# Patient Record
Sex: Female | Born: 1990 | Race: Black or African American | Hispanic: No | Marital: Single | Smoking: Never smoker
Health system: Southern US, Community
[De-identification: ages and names within clinical notes are randomized; demographics above are authoritative.]

---

## 2013-05-23 ENCOUNTER — Other Ambulatory Visit (HOSPITAL_COMMUNITY)
Admission: RE | Admit: 2013-05-23 | Discharge: 2013-05-23 | Disposition: A | Discharge status: Home / Self Care | Payer: Managed Care, Other (non HMO) | Source: Ambulatory Visit | Attending: Obstetrics and Gynecology | Admitting: Obstetrics and Gynecology

## 2013-05-23 DIAGNOSIS — Z01419 Encounter for gynecological examination (general) (routine) without abnormal findings: Secondary | ICD-10-CM | POA: Insufficient documentation

## 2013-05-23 DIAGNOSIS — Z113 Encounter for screening for infections with a predominantly sexual mode of transmission: Secondary | ICD-10-CM | POA: Insufficient documentation

## 2013-06-03 ENCOUNTER — Emergency Department (HOSPITAL_COMMUNITY)
Admission: EM | Admit: 2013-06-03 | Discharge: 2013-06-03 | Disposition: A | Discharge status: Home / Self Care | Payer: Managed Care, Other (non HMO) | Attending: Emergency Medicine | Admitting: Emergency Medicine

## 2013-06-03 ENCOUNTER — Encounter (HOSPITAL_COMMUNITY): Payer: Self-pay | Admitting: Emergency Medicine

## 2013-06-03 DIAGNOSIS — J069 Acute upper respiratory infection, unspecified: Secondary | ICD-10-CM | POA: Insufficient documentation

## 2013-06-03 DIAGNOSIS — IMO0001 Reserved for inherently not codable concepts without codable children: Secondary | ICD-10-CM | POA: Insufficient documentation

## 2013-06-03 DIAGNOSIS — J329 Chronic sinusitis, unspecified: Secondary | ICD-10-CM | POA: Insufficient documentation

## 2013-06-03 DIAGNOSIS — R61 Generalized hyperhidrosis: Secondary | ICD-10-CM | POA: Insufficient documentation

## 2013-06-03 DIAGNOSIS — R3915 Urgency of urination: Secondary | ICD-10-CM | POA: Insufficient documentation

## 2013-06-03 LAB — RAPID STREP SCREEN (MED CTR MEBANE ONLY): Streptococcus, Group A Screen (Direct): NEGATIVE

## 2013-06-03 MED ORDER — CLINDAMYCIN HCL 300 MG PO CAPS: 300.0000 mg | ORAL_CAPSULE | Freq: Four times a day (QID) | ORAL | Status: DC

## 2013-06-03 NOTE — ED Notes (Signed)
Pt states Tues night started having night sweats, body aches, bad headaches. Pt feels congested and feels as though she has a fever.

## 2013-06-03 NOTE — ED Provider Notes (Signed)
CSN: [id number]     Arrival date & time 06/03/13  1017 History  This chart was scribed for non-physician practitioner, Trevor Mace, PA-C, working with Roney Marion, MD by Shari Heritage, ED Scribe. This patient was seen in room TR09C/TR09C and the patient's care was started at 10:31 AM.    Chief Complaint  Patient presents with  . URI   The history is provided by the patient. No language interpreter was used.    HPI Comments: Brandy James is a 22 y.o. female who presents to the Emergency Department complaining of constant severe sore throat and dry cough onset 2 days ago. She reports associated body aches, night sweats, headaches, postnasal drip, and congestion. She has been taking Alka Seltzer but her symptoms have persisted without relief. She denies fever or other symptoms at this time. She denies known sick contacts, but she works at [employer] [place] so she is around other frequently. She has no chronic medical conditions. She does not smoke.   History reviewed. No pertinent past medical history. History reviewed. No pertinent past surgical history. History reviewed. No pertinent family history. History  Substance Use Topics  . Smoking status: Not on file  . Smokeless tobacco: Not on file  . Alcohol Use: Not on file   OB History   No data available     Review of Systems  Constitutional: Positive for diaphoresis.  HENT: Positive for congestion, postnasal drip and sore throat.   Respiratory: Positive for cough.   Genitourinary: Positive for urgency.  Musculoskeletal: Positive for myalgias (body aches).  Skin: Negative for rash.  All other systems reviewed and are negative.    Allergies  Review of patient's allergies indicates not on file.  Home Medications  No current outpatient prescriptions on file. Triage Vitals: BP 136/78  Pulse 92  Temp(Src) 97.3 F (36.3 C) (Oral)  Resp 16  SpO2 99%  LMP 03/06/2013 Physical Exam  Nursing note and vitals  reviewed. Constitutional: She is oriented to person, place, and time. She appears well-developed and well-nourished. No distress.  HENT:  Head: Normocephalic and atraumatic.  Right Ear: External ear normal.  Left Ear: External ear normal.  Nose: Mucosal edema present. Right sinus exhibits maxillary sinus tenderness and frontal sinus tenderness. Left sinus exhibits maxillary sinus tenderness and frontal sinus tenderness.  Mouth/Throat: Oropharynx is clear and moist. No oropharyngeal exudate.  Tonsils enlarged and inflamed bilaterally +2. No exudate. Postnasal drip. Enlarged and tender tonsillar and anterior cervical lymph nodes.  Eyes: EOM are normal.  Neck: Neck supple. No tracheal deviation present.  Cardiovascular: Normal rate.   Pulmonary/Chest: Effort normal and breath sounds normal. No respiratory distress. She has no decreased breath sounds. She has no wheezes. She has no rhonchi. She has no rales.  Musculoskeletal: Normal range of motion.  Neurological: She is alert and oriented to person, place, and time.  Skin: Skin is warm and dry.  Psychiatric: She has a normal mood and affect. Her behavior is normal.    ED Course  Procedures (including critical care time) DIAGNOSTIC STUDIES: Oxygen Saturation is 99% on room air, normal by my interpretation.    COORDINATION OF CARE: 10:32 AM- Will order strep screen. If negative, advised patient to rest, take acetaminophen or ibuprofen for symptoms, and drink plenty of fluids Patient informed of current plan for treatment and evaluation and agrees with plan at this time.   Results for orders placed during the hospital encounter of 06/03/13  RAPID STREP SCREEN  Result Value Range   Streptococcus, Group A Screen (Direct) NEGATIVE  NEGATIVE    EKG Interpretation   None       MDM   1. URI (upper respiratory infection)   2. Sinusitis    Well appearing, NAD, normal VS. Rapid strep negative. Symptomatic tx discussed. Return  precautions given. Patient states understanding of treatment care plan and is agreeable.   I personally performed the services described in this documentation, which was scribed in my presence. The recorded information has been reviewed and is accurate.    Trevor Mace, PA-C 06/03/13 1105

## 2013-06-05 LAB — CULTURE, GROUP A STREP

## 2013-06-13 NOTE — ED Provider Notes (Signed)
Medical screening examination/treatment/procedure(s) were performed by non-physician practitioner and as supervising physician I was immediately available for consultation/collaboration.  EKG Interpretation   None         Dilraj Killgore J Roise Emert, MD 06/13/13 2310 

## 2013-10-12 ENCOUNTER — Emergency Department (HOSPITAL_COMMUNITY): Payer: BC Managed Care – PPO

## 2013-10-12 ENCOUNTER — Emergency Department (HOSPITAL_COMMUNITY)
Admission: EM | Admit: 2013-10-12 | Discharge: 2013-10-12 | Disposition: A | Discharge status: Home / Self Care | Payer: BC Managed Care – PPO | Attending: Emergency Medicine | Admitting: Emergency Medicine

## 2013-10-12 ENCOUNTER — Encounter (HOSPITAL_COMMUNITY): Payer: Self-pay | Admitting: Emergency Medicine

## 2013-10-12 DIAGNOSIS — M25512 Pain in left shoulder: Secondary | ICD-10-CM

## 2013-10-12 DIAGNOSIS — M25519 Pain in unspecified shoulder: Secondary | ICD-10-CM | POA: Insufficient documentation

## 2013-10-12 DIAGNOSIS — Z3202 Encounter for pregnancy test, result negative: Secondary | ICD-10-CM | POA: Insufficient documentation

## 2013-10-12 DIAGNOSIS — M791 Myalgia, unspecified site: Secondary | ICD-10-CM

## 2013-10-12 DIAGNOSIS — IMO0001 Reserved for inherently not codable concepts without codable children: Secondary | ICD-10-CM | POA: Insufficient documentation

## 2013-10-12 DIAGNOSIS — N390 Urinary tract infection, site not specified: Secondary | ICD-10-CM | POA: Insufficient documentation

## 2013-10-12 LAB — URINE MICROSCOPIC-ADD ON

## 2013-10-12 LAB — URINALYSIS, ROUTINE W REFLEX MICROSCOPIC
Bilirubin Urine: NEGATIVE
GLUCOSE, UA: NEGATIVE mg/dL
HGB URINE DIPSTICK: NEGATIVE
Ketones, ur: NEGATIVE mg/dL
Nitrite: NEGATIVE
PH: 6 (ref 5.0–8.0)
PROTEIN: 100 mg/dL — AB
Specific Gravity, Urine: 1.027 (ref 1.005–1.030)
Urobilinogen, UA: 0.2 mg/dL (ref 0.0–1.0)

## 2013-10-12 LAB — PREGNANCY, URINE: PREG TEST UR: NEGATIVE

## 2013-10-12 MED ORDER — NAPROXEN 375 MG PO TABS: 375.0000 mg | ORAL_TABLET | Freq: Two times a day (BID) | ORAL | Status: AC

## 2013-10-12 MED ORDER — CIPROFLOXACIN HCL 250 MG PO TABS: 250.0000 mg | ORAL_TABLET | Freq: Two times a day (BID) | ORAL | Status: AC

## 2013-10-12 MED ORDER — CYCLOBENZAPRINE HCL 10 MG PO TABS: 10.0000 mg | ORAL_TABLET | Freq: Two times a day (BID) | ORAL | Status: AC | PRN

## 2013-10-12 NOTE — ED Notes (Signed)
Patient returned from radiology

## 2013-10-12 NOTE — Discharge Instructions (Signed)
Please call your doctor for a followup appointment within 24-48 hours. When you talk to your doctor please let them know that you were seen in the emergency department and have them acquire all of your records so that they can discuss the findings with you and formulate a treatment plan to fully care for your new and ongoing problems. Please call and set-up an appointment with health and wellness center to be re-assessed Please rest and stay hydrated Please take medications as prescribed Please drink plenty of water and can drink watered down cranberry juice Please avoid any physical or strenuous activity Please apply ice and massage Please continue to monitor symptoms and if symptoms are to worsen or change (fever greater than 101, chills, chest pain, shortness of breath, difficulty breathing, neck pain, neck stiffness, abdominal pain, nausea, vomiting, diarrhea, blood in stools, black for stools, dizziness, headache, fall, numbness, tingling, pain with urination, decreased urination) please report back to the ED immediately  Myalgia, Adult Myalgia is the medical term for muscle pain. It is a symptom of many things. Nearly everyone at some time in their life has this. The most common cause for muscle pain is overuse or straining and more so when you are not in shape. Injuries and muscle bruises cause myalgias. Muscle pain without a history of injury can also be caused by a virus. It frequently comes along with the flu. Myalgia not caused by muscle strain can be present in a large number of infectious diseases. Some autoimmune diseases like lupus and fibromyalgia can cause muscle pain. Myalgia may be mild, or severe. SYMPTOMS  The symptoms of myalgia are simply muscle pain. Most of the time this is short lived and the pain goes away without treatment. DIAGNOSIS  Myalgia is diagnosed by your caregiver by taking your history. This means you tell him when the problems began, what they are, and what has  been happening. If this has not been a long term problem, your caregiver may want to watch for a while to see what will happen. If it has been long term, they may want to do additional testing. TREATMENT  The treatment depends on what the underlying cause of the muscle pain is. Often anti-inflammatory medications will help. HOME CARE INSTRUCTIONS  If the pain in your muscles came from overuse, slow down your activities until the problems go away.  Myalgia from overuse of a muscle can be treated with alternating hot and cold packs on the muscle affected or with cold for the first couple days. If either heat or cold seems to make things worse, stop their use.  Apply ice to the sore area for 15-20 minutes, 03-04 times per day, while awake for the first 2 days of muscle soreness, or as directed. Put the ice in a plastic bag and place a towel between the bag of ice and your skin.  Only take over-the-counter or prescription medicines for pain, discomfort, or fever as directed by your caregiver.  Regular gentle exercise may help if you are not active.  Stretching before strenuous exercise can help lower the risk of myalgia. It is normal when beginning an exercise regimen to feel some muscle pain after exercising. Muscles that have not been used frequently will be sore at first. If the pain is extreme, this may mean injury to a muscle. SEEK MEDICAL CARE IF:  You have an increase in muscle pain that is not relieved with medication.  You begin to run a temperature.  You develop  nausea and vomiting.  You develop a stiff and painful neck.  You develop a rash.  You develop muscle pain after a tick bite.  You have continued muscle pain while working out even after you are in good condition. SEEK IMMEDIATE MEDICAL CARE IF: Any of your problems are getting worse and medications are not helping. MAKE SURE YOU:   Understand these instructions.  Will watch your condition.  Will get help right  away if you are not doing well or get worse. Document Released: 05/08/2006 Document Revised: 09/08/2011 Document Reviewed: 07/28/2006 [facility] Patient Information 2014 [place], [place].  Urinary Tract Infection Urinary tract infections (UTIs) can develop anywhere along your urinary tract. Your urinary tract is your body's drainage system for removing wastes and extra water. Your urinary tract includes two kidneys, two ureters, a bladder, and a urethra. Your kidneys are a pair of bean-shaped organs. Each kidney is about the size of your fist. They are located below your ribs, one on each side of your spine. CAUSES Infections are caused by microbes, which are microscopic organisms, including fungi, viruses, and bacteria. These organisms are so small that they can only be seen through a microscope. Bacteria are the microbes that most commonly cause UTIs. SYMPTOMS  Symptoms of UTIs may vary by age and gender of the patient and by the location of the infection. Symptoms in young women typically include a frequent and intense urge to urinate and a painful, burning feeling in the bladder or urethra during urination. Older women and men are more likely to be tired, shaky, and weak and have muscle aches and abdominal pain. A fever may mean the infection is in your kidneys. Other symptoms of a kidney infection include pain in your back or sides below the ribs, nausea, and vomiting. DIAGNOSIS To diagnose a UTI, your caregiver will ask you about your symptoms. Your caregiver also will ask to provide a urine sample. The urine sample will be tested for bacteria and white blood cells. White blood cells are made by your body to help fight infection. TREATMENT  Typically, UTIs can be treated with medication. Because most UTIs are caused by a bacterial infection, they usually can be treated with the use of antibiotics. The choice of antibiotic and length of treatment depend on your symptoms and the type of bacteria  causing your infection. HOME CARE INSTRUCTIONS  If you were prescribed antibiotics, take them exactly as your caregiver instructs you. Finish the medication even if you feel better after you have only taken some of the medication.  Drink enough water and fluids to keep your urine clear or pale yellow.  Avoid caffeine, tea, and carbonated beverages. They tend to irritate your bladder.  Empty your bladder often. Avoid holding urine for long periods of time.  Empty your bladder before and after sexual intercourse.  After a bowel movement, women should cleanse from front to back. Use each tissue only once. SEEK MEDICAL CARE IF:   You have back pain.  You develop a fever.  Your symptoms do not begin to resolve within 3 days. SEEK IMMEDIATE MEDICAL CARE IF:   You have severe back pain or lower abdominal pain.  You develop chills.  You have nausea or vomiting.  You have continued burning or discomfort with urination. MAKE SURE YOU:   Understand these instructions.  Will watch your condition.  Will get help right away if you are not doing well or get worse. Document Released: 03/26/2005 Document Revised: 12/16/2011 Document Reviewed:  07/25/2011 ExitCare Patient Information 2014 [place], [place].   Emergency Department Resource Guide 1) Find a Doctor and Pay Out of Pocket Although you won't have to find out who is covered by your insurance plan, it is a good idea to ask around and get recommendations. You will then need to call the office and see if the doctor you have chosen will accept you as a new patient and what types of options they offer for patients who are self-pay. Some doctors offer discounts or will set up payment plans for their patients who do not have insurance, but you will need to ask so you aren't surprised when you get to your appointment.  2) Contact Your Local Health Department Not all health departments have doctors that can see patients for sick visits, but  many do, so it is worth a call to see if yours does. If you don't know where your local health department is, you can check in your phone book. The CDC also has a tool to help you locate your state's health department, and many state websites also have listings of all of their local health departments.  3) Find a Walk-in Clinic If your illness is not likely to be very severe or complicated, you may want to try a walk in clinic. These are popping up all over the country in pharmacies, drugstores, and shopping centers. They're usually staffed by nurse practitioners or physician assistants that have been trained to treat common illnesses and complaints. They're usually fairly quick and inexpensive. However, if you have serious medical issues or chronic medical problems, these are probably not your best option.  No Primary Care Doctor: - Call Health Connect at  [phone] - they can help you locate a primary care doctor that  accepts your insurance, provides certain services, etc. - Physician Referral Service- [phone]  Chronic Pain Problems: Organization         Address  Phone   Notes  [facility]  [phone] Patients need to be referred by their primary care doctor.   Medication Assistance: Organization         Address  Phone   Notes  [facility] Medication [facility] [address] [place], [place] [address] [phone] --Must be a resident of [facility] -- Must have NO insurance coverage whatsoever (no Medicaid/ Medicare, etc.) -- The pt. MUST have a primary care doctor that directs their care regularly and follows them in the community   [facility]  [phone]   [employer]  [phone]    Agencies that provide inexpensive medical care: Organization         Address  Phone   Notes  [facility]  [phone]   [facility]    [phone]   [facility] [address] [place], [place] [address] [phone]   [facility] [address] [place] [phone]   Planned Parenthood    [phone]   [facility]    [phone]   [facility]  [address], [place] Phone:  [phone], Fax:  [phone] Hours of Operation:  9 am - 6 pm, M-F.  Also accepts Medicaid/Medicare and self-pay.  [facility]  [address], [place] Phone: [phone], Fax: [phone]. Hours of Operation:  8:30 am - 5:30 pm, M-F.  Also accepts Medicaid and self-pay.  [facility] [place] [address] [place] Phone: [phone]   [facility] [address] [place] [place], [place] [phone], Ext. [phone] Mondays & Thursdays: 7-9 AM.  First 15 patients are seen on a first come, first serve basis.    Medicaid-accepting [facility] Providers:  Organization         Address  Phone   Notes  [facility] [address],  [phone] Also accepts self-pay patients.  [facility] [address] Laurell Josephs [place], [place]  [phone]   [facility] [address], [place] [phone]   [facility] [address], [place] [phone]   Renaye Rakers [address], [place]   [phone] Only accepts [place] patients after they have their name applied to their card.   Self-Pay (no insurance) in [facility]:  Organization         Address  Phone   Notes  Sickle Cell Patients, [facility] [address] [place], [place] [phone]   [facility] [address] [place], [place] [phone]   [facility] [place]  [address], [place] [phone]   [facility]/Dr. Osei-Bonsu  [address], [place] or  [address], [place] [phone] Phone number for both [place] and [place] locations is the same.  [facility] [address], [place] [phone]   [facility] [address], [place] or [address] [phone]   [facility] [address] [place] [phone], phone; [phone], fax Sees patients 1st and 3rd Saturday of every month.  Must not qualify for public or private insurance (i.e. Medicaid, Medicare, [place] Health Choice, Veterans' Benefits)  Household income should be no more than 200% of the poverty level The clinic cannot treat you if you are pregnant or think you are pregnant  Sexually transmitted diseases are not treated at the clinic.    Dental Care: Organization         Address  Phone  Notes  [facility] [address] [place], [place] [phone] Accepts children up to age 30 who are enrolled in [place] or [place] Health Choice; pregnant women with a Medicaid card; and children who have applied for Medicaid or [place] Health Choice, but were declined, whose parents can pay a reduced fee at time of service.  [facility] of [facility]  [address], [place] [phone] Accepts children up to age 58 who are enrolled in [place] or [place] Health Choice; pregnant women with a Medicaid card; and children who have applied for Medicaid or [place] Health Choice, but were declined, whose parents can pay a reduced fee at time of service.  [facility]  [address] [place] [address] [place], [place] [phone] Patients are seen by appointment only. Walk-ins are not accepted. [facility] will see patients 78 years of age and older. Monday - Tuesday (8am-5pm) Most Wednesdays (8:30-5pm) $30 per visit, cash only  [facility]  [address], [facility] [phone] Patients are seen by appointment only. Walk-ins are not accepted. [facility]  will see patients 22 years of age and older. One Wednesday Evening (Monthly: Volunteer Based).  $30 per visit, cash only  [employer] [facility]  [phone]) [phone] for adults; Children under age 544, call Graduate Pediatric Dentistry at [phone]. Children aged 464-14, please call [phone] to request a pediatric application.  Dental services are provided in all areas of dental care including fillings, crowns and bridges, complete and partial dentures, implants, gum treatment, root canals, and extractions. Preventive care is also provided. Treatment is provided to both adults and children. Patients are selected via a lottery and there is often a waiting list.   [facility] [address], [place]  [phone] [email or web address]   [facility] [address], [place], [place] [phone], Ext. [phone] Second and Fourth Thursday of each month, opens at 6:30 AM; Clinic ends at 9 AM.  Patients are seen on a first-come first-served basis, and a limited number are seen during each clinic.   [facility]  [address], [place], [place] [phone]   Eligibility Requirements You must have lived in [place], [place], or [place] counties for at least the last three months.   You cannot be eligible for state or federal sponsored [place] Cityhealthcare insurance, including CIGNAVeterans Administration, [place]icaid, or [employer]icare.   You generally cannot be eligible for healthcare insurance through your employer.    How to apply: Eligibility screenings are held every Tuesday and Wednesday afternoon from 1:00 pm until 4:00 pm. You do not need an appointment for the interview!  [facility] [address], [place], [place] [phone]   [facility]  [phone]   [facility]  [phone]   [facility]  [phone]    Behavioral Health Resources in the Community: Intensive Outpatient Programs Organization         Address  Phone  Notes  [facility] [address], [place], [place] [phone]   [facility] [address], [place], [place] [phone]   [facility] [address] [place] [address], [place], [place]  [phone]   [facility] [address],  [place], [place] [phone] or [phone]   Substance Abuse Resources Organization         Address  Phone  Notes  [facility]  [phone]   [facility]  [phone]   [facility]  [phone]   [facility]  [phone]   Residential & Outpatient Substance Abuse Program  [phone]   Psychological Services Organization         Address  Phone  Notes  [facility]  [phone]- [phone]   [facility]  [phone]- [phone]   [facility] [address], [place] [phone] or [phone]    Mobile Crisis Teams Organization         Address  Phone  Notes  [facility], Mobile Crisis Care Unit  [phone]   [facility]  [address] [place], [place] [phone]   Doristine LocksSharon DeEsch [address] [place] [phone]    Self-Help/Support Groups Organization         Address  Phone             Notes  [facility] [place] - variety of support groups  336- [phone] Call for more information  Narcotics Anonymous (NA), [facility] [address],  [place]  2 meetings at this location   Residential Treatment Programs Organization         Address  Phone  Notes  [facility] [address],    [place]  [phone]   [facility]  [address], [place] [phone], [place], [place] [phone]   [facility] [address], [place] [phone]  Admissions: 8am-3pm M-F  [facility] [address]    [place], [place] [phone]   [facility] [address], [place], [place] [phone]   [facility] [address]  [place], [place] [phone]   [facility] - Intensive Outpatient [address], [place], [place] [phone]   [facility] ([facility] [address]  [place], [place] [phone] or [phone]   [facility] ([facility]) [address] [place], [place] [phone] Accepts Medicaid  [facility] [address]  [place] [phone] Substance Abuse/Addiction Treatment   [facility] [place] [facility] Resources Organization         Address  Phone  Notes  [facility]  [phone]) [phone]   Angie FavaJulie Brannon, PhD [address], [address] [place], [place]   [phone] or [phone]) [phone]   [facility]   [address] [place], [place] [phone]   [facility] [address], [place], [place] [phone]) [phone] Insurance/Medicaid/sponsorship through [facility]  [facility] [address]                                    [place], [place] [phone]) [phone] Therapy/tele-psych/case  [facility] [address].   [place], [place] [phone]    Dr. Lolly MustacheArfeen  [phone]) [phone]   [facility]  [employer] [facility] 1) [address], [place] 2) [address], [place] 3)  [address], [place] [phone]  [phone]) [phone]   [facility] Child Abuse Hotline [phone]) [phone] or [phone](336) [phone] (After Hours)

## 2013-10-12 NOTE — ED Notes (Signed)
Patient states movement makes the pain worse.    Patient states hard to get comfortable when she lays down.  Patient has taken advil 400mg  with no relief.   Patient denies any other symptoms.

## 2013-10-12 NOTE — ED Notes (Signed)
Pt c/o L scapula/shoulder pain.  Pt denies injury/trauma.  Pt alert and oriented.  Talking in complete sentences.  Able to ambulate independently without assistance.

## 2013-10-12 NOTE — ED Notes (Signed)
Disregard my discharge notes.  Entered on wrong patient.

## 2013-10-12 NOTE — ED Provider Notes (Signed)
CSN: [id number]     Arrival date & time 10/12/13  0909 History  This chart was scribed for Raymon Mutton, PA, working with Benny Lennert, MD, by Ardelia Mems ED Scribe. This patient was seen in room TR06C/TR06C and the patient's care was started at 9:49 AM.   Chief Complaint  Patient presents with  . Shoulder Pain    The history is provided by the patient. No language interpreter was used.    HPI Comments: Brandy James is a 23 y.o. female who presents to the Emergency Department complaining of gradually worsening left shoulder pain onset 4 days ago. She locates her pain to the left scapula area and states that her pain does not radiate. She states that her pain is worsened with deep breathing and with movement of her shoulder. She describes her pain as "soreness" while at rest, and as "tightness" and "pulling" while moving her shoulder. She denies any injury to have onset her pain. She states that she has taken 400 mg of Ibuprofen without relief. She denies numbness, tingling, chest pain, dyspnea, headache, back pain, neck pain, fever, chills, cough, nasal congestion, dysuria, hematuria, urinary frequency or any other symptoms.   History reviewed. No pertinent past medical history. History reviewed. No pertinent past surgical history. No family history on file. History  Substance Use Topics  . Smoking status: Never Smoker   . Smokeless tobacco: Not on file  . Alcohol Use: No   OB History   Grav Para Term Preterm Abortions TAB SAB Ect Mult Living                 Review of Systems  Constitutional: Negative for fever and chills.  HENT: Negative for congestion.   Respiratory: Negative for cough and shortness of breath.   Cardiovascular: Negative for chest pain.  Genitourinary: Negative for dysuria, frequency and hematuria.  Musculoskeletal: Positive for arthralgias (left shoulder). Negative for back pain and neck pain.  Neurological: Negative for numbness and headaches.  All  other systems reviewed and are negative.   Allergies  Review of patient's allergies indicates no known allergies.  Home Medications   Prior to Admission medications   Medication Sig Start Date End Date Taking? Authorizing Provider  Phenyleph-Doxyl-DM-Aspirin (ALKA-SELTZER PLUS DAY/NIGHT PO) Take 1 tablet by mouth 2 (two) times daily as needed (cold).    Historical Provider, MD   Triage Vitals: BP 121/73  Pulse 68  Temp(Src) 97.7 F (36.5 C) (Oral)  Resp 18  Ht 5\' 7"  (1.702 m)  Wt 120 lb (54.432 kg)  BMI 18.79 kg/m2  SpO2 100%  LMP 09/11/2013  Physical Exam  Nursing note and vitals reviewed. Constitutional: She is oriented to person, place, and time. She appears well-developed and well-nourished. No distress.  HENT:  Head: Normocephalic and atraumatic.  Mouth/Throat: Oropharynx is clear and moist. No oropharyngeal exudate.  Eyes: Conjunctivae and EOM are normal. Pupils are equal, round, and reactive to light. Right eye exhibits no discharge. Left eye exhibits no discharge.  Neck: Normal range of motion. No tracheal deviation present.  Negative neck stiffness Negative nuchal rigidity Negative cervical lymphadenopathy  Cardiovascular: Normal rate, regular rhythm and normal heart sounds.  Exam reveals no friction rub.   No murmur heard. Pulses:      Radial pulses are 2+ on the right side, and 2+ on the left side.  Pulmonary/Chest: Effort normal and breath sounds normal. No respiratory distress. She has no wheezes. She has no rales. She exhibits no tenderness.  Patient  is able to speak in full sentences without difficulty Negative use of excess her muscles Negative stridor  Musculoskeletal: Normal range of motion.  Negative swelling, erythema, formation, lesions, sores, sunken in appearance or deformities noted to the left shoulder. Discomfort upon palpation to the base of the left scapula and left paraspinal region of the upper thoracic region. Negative palpation of masses or  lesions. Muscular in nature. Negative winged scapula. Full ROM to upper and lower extremities without difficulty noted, negative ataxia noted.  Lymphadenopathy:    She has no cervical adenopathy.  Neurological: She is alert and oriented to person, place, and time. No cranial nerve deficit. She exhibits normal muscle tone. Coordination normal.  Cranial nerves III-XII grossly intact Strength 5+/5+ to upper and lower extremities bilaterally with resistance applied, equal distribution noted  Skin: Skin is warm and dry. No rash noted. She is not diaphoretic. No erythema.  Psychiatric: She has a normal mood and affect. Her behavior is normal. Thought content normal.    ED Course  Procedures (including critical care time)  DIAGNOSTIC STUDIES: Oxygen Saturation is 100% on RA, normal by my interpretation.    COORDINATION OF CARE: 9:56 AM- Discussed normal radiology findings. Discussed clinical suspicion that pt has muscular pain. Discussed plan to obtain UA. Pt advised of plan for treatment and pt agrees.  Labs Review Labs Reviewed  URINALYSIS, ROUTINE W REFLEX MICROSCOPIC - Abnormal; Notable for the following:    APPearance HAZY (*)    Protein, ur 100 (*)    Leukocytes, UA SMALL (*)    All other components within normal limits  URINE MICROSCOPIC-ADD ON - Abnormal; Notable for the following:    Squamous Epithelial / LPF FEW (*)    Bacteria, UA FEW (*)    All other components within normal limits  URINE CULTURE  PREGNANCY, URINE    Imaging Review Dg Chest 2 View  10/12/2013   CLINICAL DATA:  Chest pain, left shoulder pain  EXAM: CHEST  2 VIEW  COMPARISON:  None.  FINDINGS: The heart size and mediastinal contours are within normal limits. Both lungs are clear. The visualized skeletal structures are unremarkable.  IMPRESSION: No active cardiopulmonary disease.   Electronically Signed   By: Natasha MeadLiviu  Pop M.D.   On: 10/12/2013 09:42   Dg Shoulder Left  10/12/2013   CLINICAL DATA:  Left  shoulder pain, chest pain  EXAM: LEFT SHOULDER - 2+ VIEW  COMPARISON:  None.  FINDINGS: Three views of the left shoulder submitted. No acute fracture or subluxation. No radiopaque foreign body.  IMPRESSION: Negative.   Electronically Signed   By: Natasha MeadLiviu  Pop M.D.   On: 10/12/2013 09:41     EKG Interpretation None      MDM   Final diagnoses:  UTI (urinary tract infection)  Left shoulder pain  Muscular pain   Filed Vitals:   10/12/13 0912 10/12/13 1126 10/12/13 1128  BP: 121/73 124/63 103/82  Pulse: 68 63 95  Temp: 97.7 F (36.5 C)    TempSrc: Oral    Resp: 18 18 18   Height: 5\' 7"  (1.702 m)    Weight: 120 lb (54.432 kg)    SpO2: 100% 99% 97%    I personally performed the services described in this documentation, which was scribed in my presence. The recorded information has been reviewed and is accurate.  Patient presenting to the ED with left shoulder pain that started on Saturday localized to the left shoulder blade described as a dull soreness with breast  with a tightening pulling sensation and sharp pain with motion. Stated that the chest discomfort with deep inhalation. Stated the pain stays within the left shoulder without radiation. Stated that she's been using Advil with no relief. Denied numbness, tingling, fall, injury, chest pain, shortness of breath, difficulty breathing, weakness, numbness, tingling. Alert and oriented. GCS 15. Heart rate and rhythm normal. Lungs good auscultation to upper and lower lobes bilaterally. Negative use of accessory muscles. Negative stridor. Patient is able to speak in full sentences without difficulty. Negative deformities, sunken in appearance noted to the left shoulder. Discomfort upon palpation to the musculature of the base of the left shoulder and paraspinal region of the upper thoracic region. Negative masses or lesions palpated. Negative winged scapula. Full range of motion to left shoulder without difficulty or ataxia. Strength intact  with equal distribution with resistance applied. Sensation intact. Plain film of left shoulder negative for acute osseous injury dislocation. Chest x-ray negative for acute cardiopulmonary disease. Urine pregnancy negative. Urinalysis noted small leukocytes with white blood cell count of 11-20 with few bacteria and few squamous cells.  Doubt pneumothorax. Doubt pneumonia or pulmonary disease. Negative focal neurological deficits noted. Strength intact. Suspicion to be muscular pain, left shoulder strain, secondary to pain with motion pain upon palpation. Patient presenting with urinary tract infection-will treat patient. Patient stable, afebrile. Patient no sign of respiratory distress. Patient not septic appearing. Discharged patient with muscle relaxer, anti-inflammatory, antibiotics. Discussed with patient to rest, ice, elevate. Discussed with patient to stay hydrated. Discussed with patient to avoid any physical strenuous activity. [facility] to be reassessed. Discussed with patient to closely monitor symptoms and if symptoms are to worsen or change to report back to the ED - strict return instructions given.    Raymon MuttonMarissa Jemarion Roycroft, PA-C 10/12/13 1921

## 2013-10-13 LAB — URINE CULTURE
Colony Count: >=100000
SPECIAL REQUESTS: NORMAL

## 2013-10-13 NOTE — ED Provider Notes (Signed)
Medical screening examination/treatment/procedure(s) were performed by non-physician practitioner and as supervising physician I was immediately available for consultation/collaboration.   EKG Interpretation None        Seanpatrick Maisano L Johnrobert Foti, MD 10/13/13 1430 

## 2014-11-09 ENCOUNTER — Ambulatory Visit: Payer: BLUE CROSS/BLUE SHIELD | Admitting: Internal Medicine

## 2014-11-10 ENCOUNTER — Ambulatory Visit: Payer: BLUE CROSS/BLUE SHIELD | Admitting: Internal Medicine

## 2014-11-10 DIAGNOSIS — Z0289 Encounter for other administrative examinations: Secondary | ICD-10-CM

## 2015-08-27 IMAGING — CR DG CHEST 2V
2 series · 2 of 2 positions shown · non-contrast
Comparison: None.

CLINICAL DATA: Chest pain, left shoulder pain

EXAM:
CHEST  2 VIEW

[w chest pa]
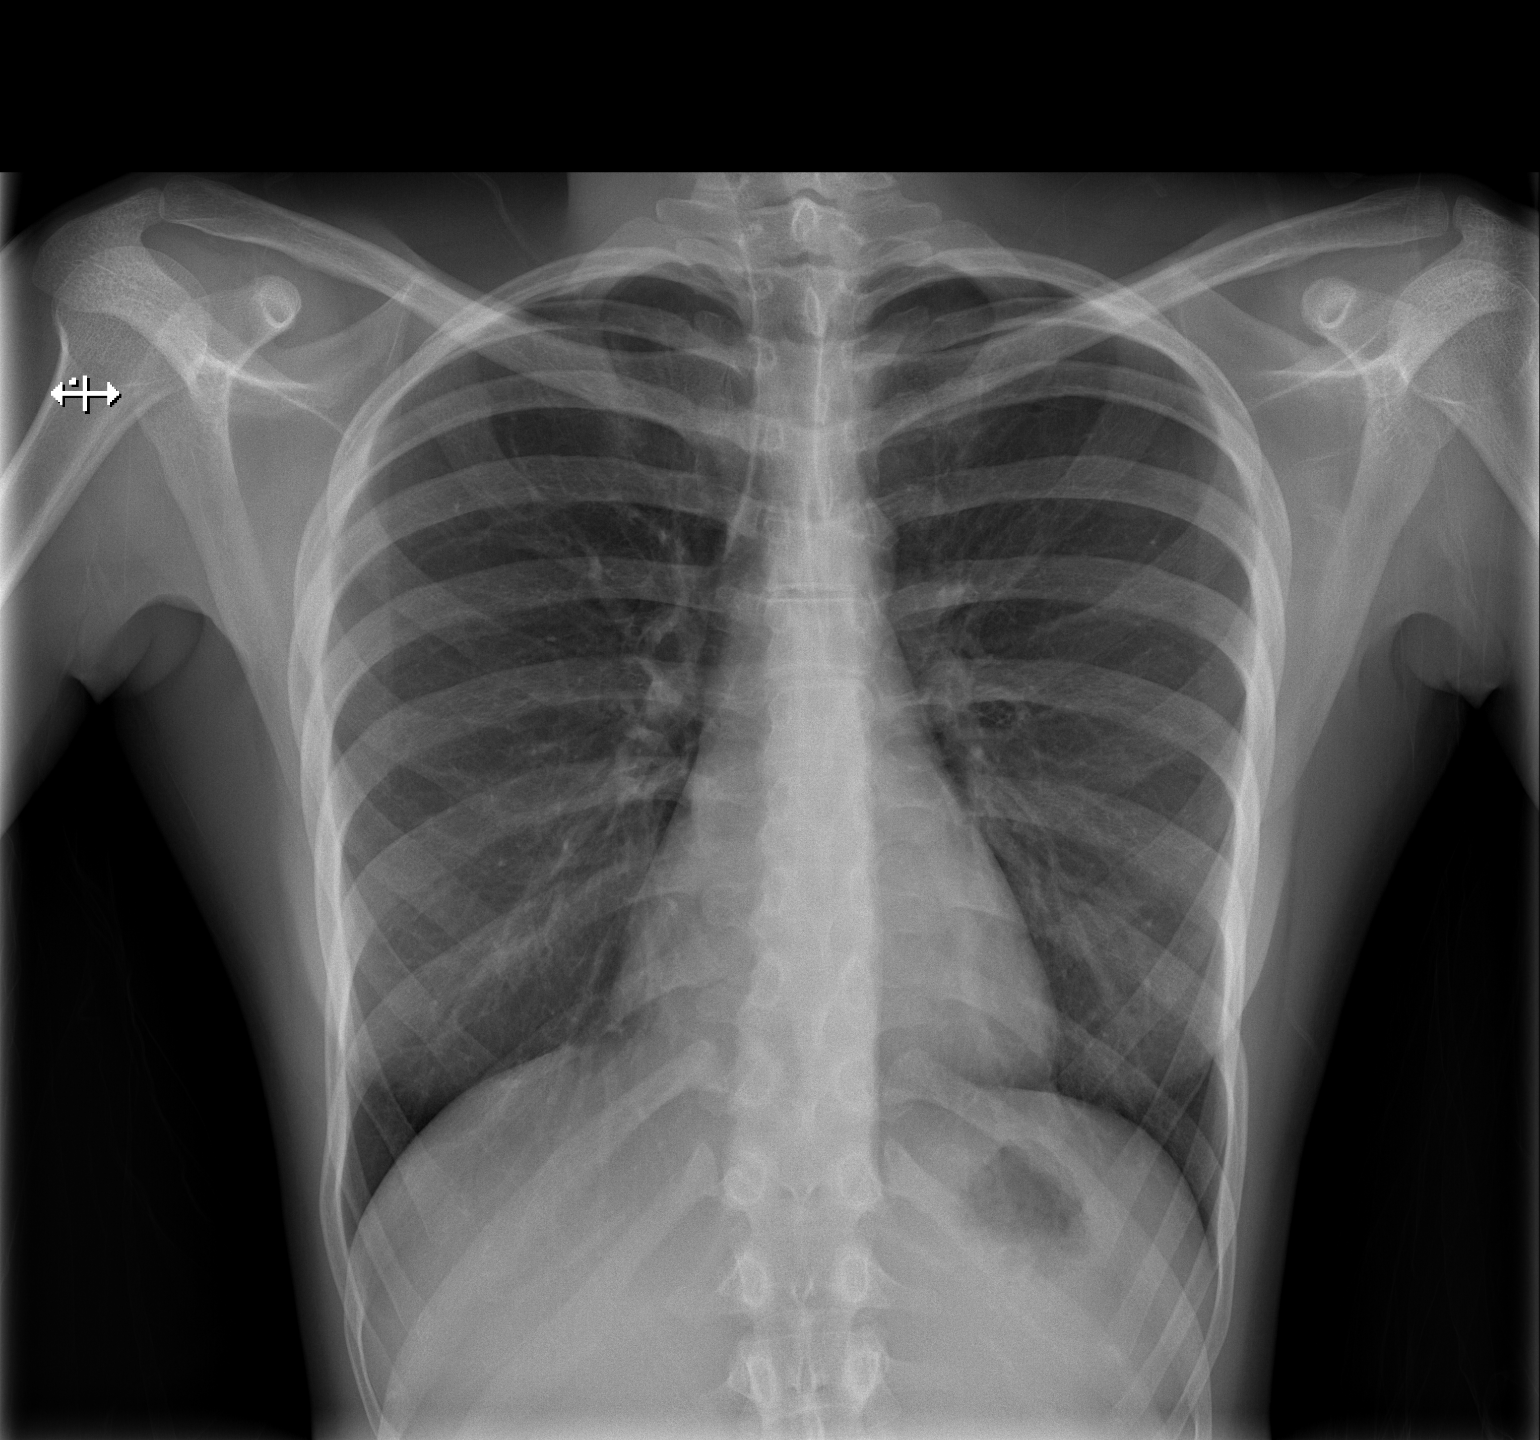

[w chest lat]
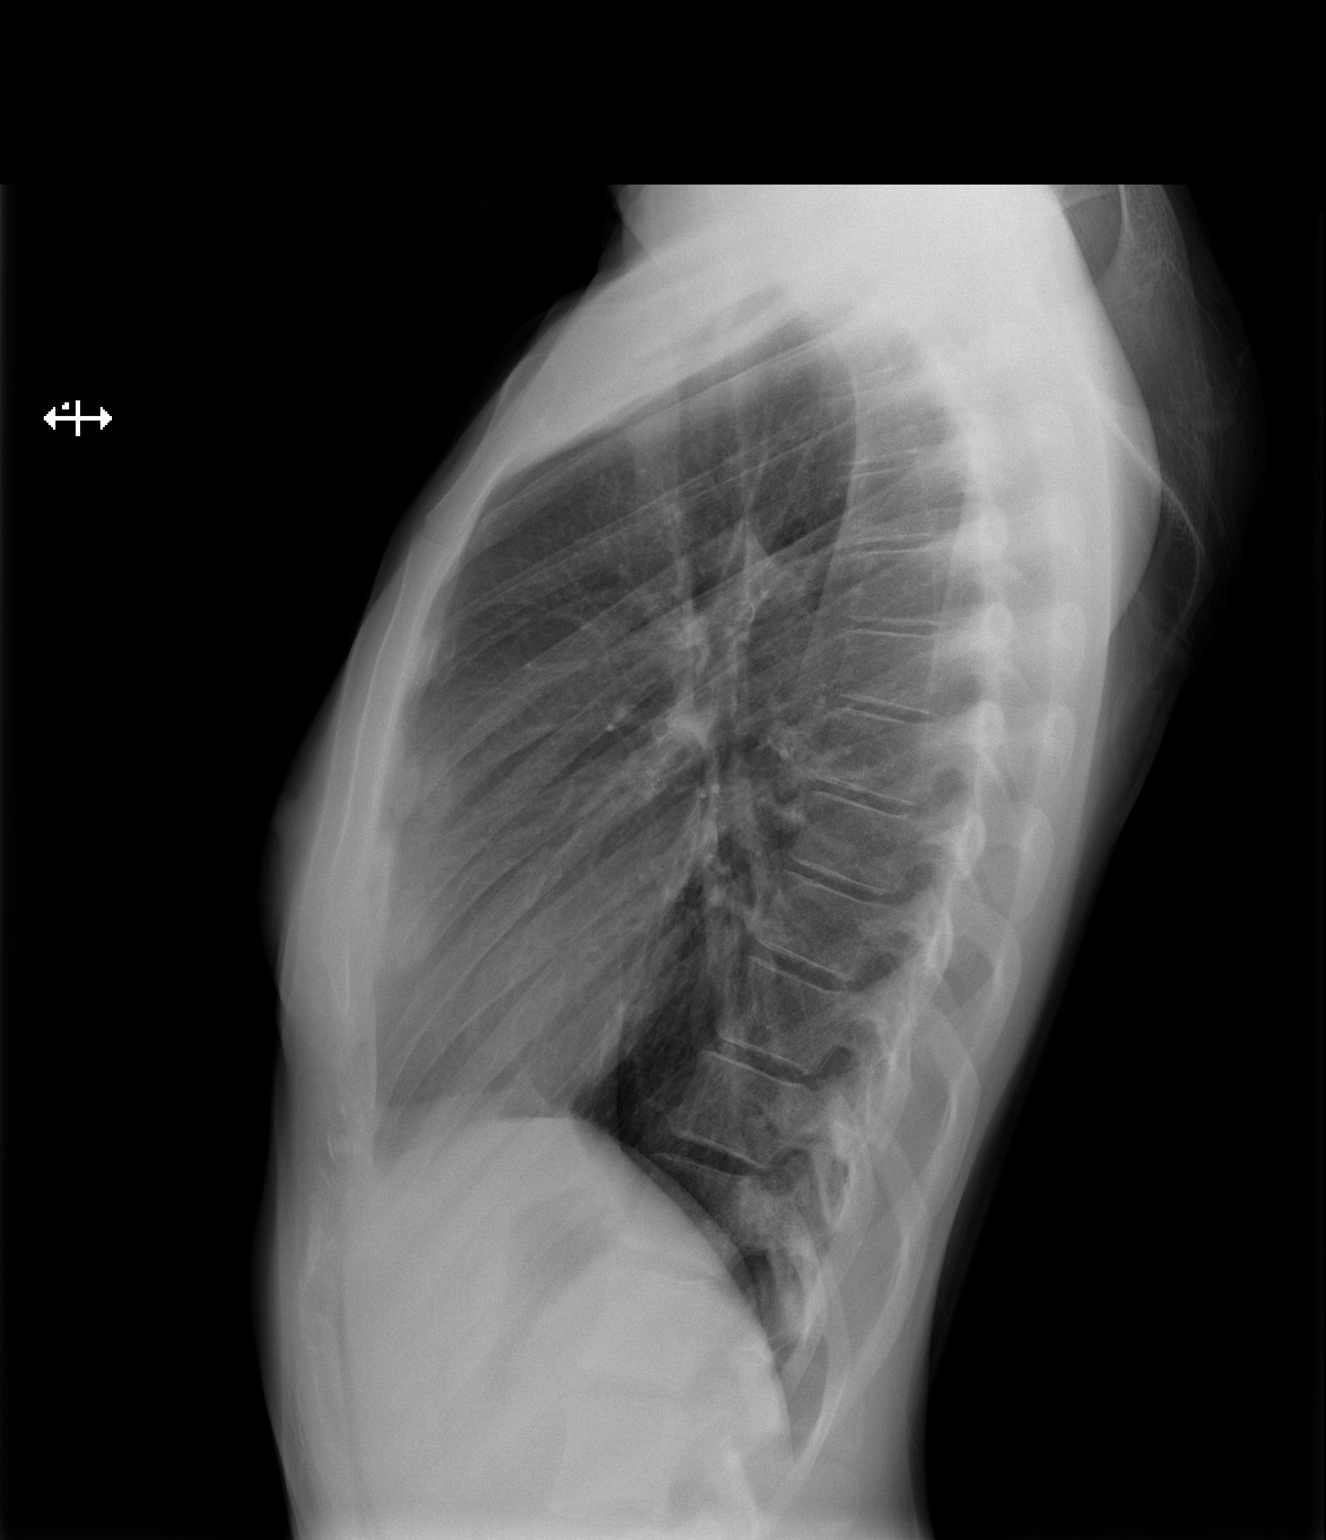

[2 of 2 positions shown; findings below may reference images not displayed]

FINDINGS: The heart size and mediastinal contours are within normal limits.
Both lungs are clear. The visualized skeletal structures are
unremarkable.
IMPRESSION: No active cardiopulmonary disease.
